# Patient Record
Sex: Female | Born: 1960
Health system: Southern US, Community
[De-identification: ages and names within clinical notes are randomized; demographics above are authoritative.]

## PROBLEM LIST (undated history)

## (undated) HISTORY — PX: BREAST CYST ASPIRATION: SHX578

---

## 2016-07-26 ENCOUNTER — Other Ambulatory Visit: Payer: Self-pay | Admitting: Obstetrics

## 2016-07-26 DIAGNOSIS — R5381 Other malaise: Secondary | ICD-10-CM

## 2016-07-27 ENCOUNTER — Other Ambulatory Visit: Payer: Self-pay | Admitting: Obstetrics

## 2016-07-27 DIAGNOSIS — R921 Mammographic calcification found on diagnostic imaging of breast: Secondary | ICD-10-CM

## 2016-07-27 DIAGNOSIS — R5381 Other malaise: Secondary | ICD-10-CM

## 2016-08-10 ENCOUNTER — Other Ambulatory Visit: Payer: Self-pay | Admitting: Obstetrics

## 2016-08-10 ENCOUNTER — Ambulatory Visit
Admission: RE | Admit: 2016-08-10 | Discharge: 2016-08-10 | Disposition: A | Discharge status: Home / Self Care | Payer: BLUE CROSS/BLUE SHIELD | Source: Ambulatory Visit | Attending: Obstetrics | Admitting: Obstetrics

## 2016-08-10 ENCOUNTER — Ambulatory Visit
Admission: RE | Admit: 2016-08-10 | Discharge: 2016-08-10 | Disposition: A | Discharge status: Home / Self Care | Payer: Self-pay | Source: Ambulatory Visit | Attending: Obstetrics | Admitting: Obstetrics

## 2016-08-10 DIAGNOSIS — R921 Mammographic calcification found on diagnostic imaging of breast: Secondary | ICD-10-CM

## 2016-08-21 ENCOUNTER — Other Ambulatory Visit: Payer: Self-pay | Admitting: Obstetrics

## 2016-08-21 DIAGNOSIS — R921 Mammographic calcification found on diagnostic imaging of breast: Secondary | ICD-10-CM

## 2016-11-06 ENCOUNTER — Ambulatory Visit
Admission: RE | Admit: 2016-11-06 | Discharge: 2016-11-06 | Disposition: A | Discharge status: Home / Self Care | Payer: BLUE CROSS/BLUE SHIELD | Source: Ambulatory Visit | Attending: Obstetrics | Admitting: Obstetrics

## 2016-11-06 ENCOUNTER — Other Ambulatory Visit: Payer: Self-pay | Admitting: Obstetrics

## 2016-11-06 DIAGNOSIS — N644 Mastodynia: Secondary | ICD-10-CM

## 2016-11-06 DIAGNOSIS — R921 Mammographic calcification found on diagnostic imaging of breast: Secondary | ICD-10-CM

## 2017-08-05 ENCOUNTER — Other Ambulatory Visit: Payer: Self-pay | Admitting: Obstetrics

## 2017-08-05 DIAGNOSIS — Z1231 Encounter for screening mammogram for malignant neoplasm of breast: Secondary | ICD-10-CM

## 2017-10-05 ENCOUNTER — Encounter (HOSPITAL_COMMUNITY): Payer: Self-pay | Admitting: Emergency Medicine

## 2017-10-05 ENCOUNTER — Other Ambulatory Visit: Payer: Self-pay

## 2017-10-05 ENCOUNTER — Emergency Department (HOSPITAL_COMMUNITY)
Admission: EM | Admit: 2017-10-05 | Discharge: 2017-10-05 | Disposition: A | Discharge status: Home / Self Care | Payer: BLUE CROSS/BLUE SHIELD | Attending: Emergency Medicine | Admitting: Emergency Medicine

## 2017-10-05 DIAGNOSIS — Y998 Other external cause status: Secondary | ICD-10-CM | POA: Insufficient documentation

## 2017-10-05 DIAGNOSIS — Y9389 Activity, other specified: Secondary | ICD-10-CM | POA: Insufficient documentation

## 2017-10-05 DIAGNOSIS — S060X0A Concussion without loss of consciousness, initial encounter: Secondary | ICD-10-CM | POA: Diagnosis not present

## 2017-10-05 DIAGNOSIS — S0990XA Unspecified injury of head, initial encounter: Secondary | ICD-10-CM | POA: Diagnosis present

## 2017-10-05 DIAGNOSIS — Y92414 Local residential or business street as the place of occurrence of the external cause: Secondary | ICD-10-CM | POA: Diagnosis not present

## 2017-10-05 MED ORDER — NAPROXEN 500 MG PO TABS: 500.0000 mg | ORAL_TABLET | Freq: Two times a day (BID) | ORAL | 0 refills | Status: AC

## 2017-10-05 MED ORDER — METHOCARBAMOL 500 MG PO TABS: 500.0000 mg | ORAL_TABLET | Freq: Every evening | ORAL | 0 refills | Status: AC | PRN
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
## 2017-10-11 ENCOUNTER — Other Ambulatory Visit: Payer: Self-pay | Admitting: Obstetrics

## 2017-10-11 DIAGNOSIS — R921 Mammographic calcification found on diagnostic imaging of breast: Secondary | ICD-10-CM

## 2017-11-08 ENCOUNTER — Ambulatory Visit
Admission: RE | Admit: 2017-11-08 | Discharge: 2017-11-08 | Disposition: A | Discharge status: Home / Self Care | Payer: BLUE CROSS/BLUE SHIELD | Source: Ambulatory Visit | Attending: Obstetrics | Admitting: Obstetrics

## 2017-11-08 DIAGNOSIS — R921 Mammographic calcification found on diagnostic imaging of breast: Secondary | ICD-10-CM

## 2018-09-25 ENCOUNTER — Other Ambulatory Visit: Payer: Self-pay | Admitting: Obstetrics

## 2018-10-11 IMAGING — MG 2D DIGITAL DIAGNOSTIC BILATERAL MAMMOGRAM WITH CAD AND ADJUNCT T
8 of 14 series · 8 of 30 positions shown · non-contrast
Comparison: Previous exam(s).

CLINICAL DATA: 55-year-old female presenting for annual bilateral
mammogram in short-term follow-up for left breast calcifications.
The patient reports intermittent left subareolar pain for several
days.

EXAM:
2D DIGITAL DIAGNOSTIC BILATERAL MAMMOGRAM WITH CAD AND ADJUNCT TOMO
ULTRASOUND LEFT BREAST

[L ML]
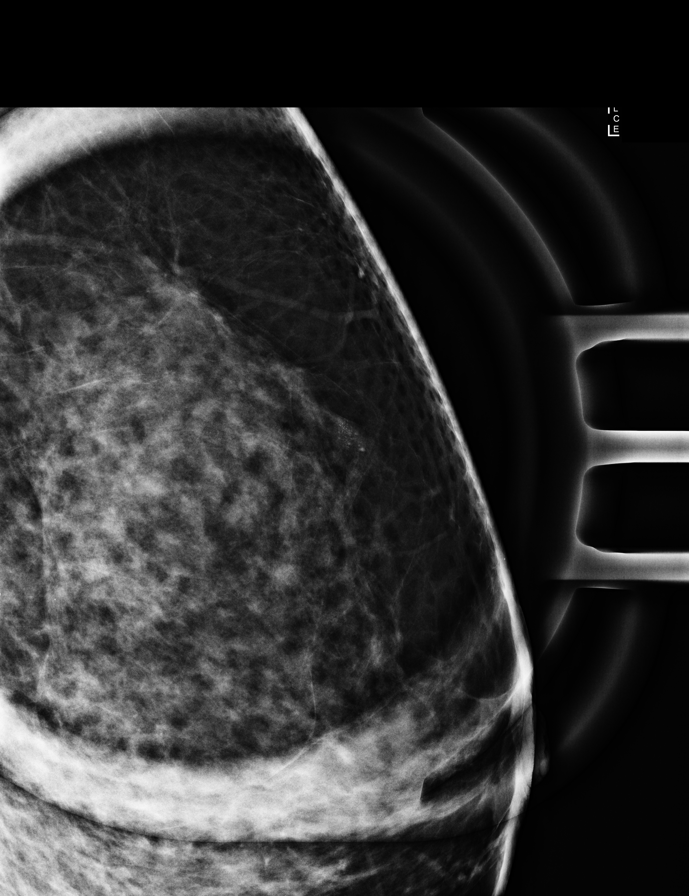

[L CC (1 of 2)]
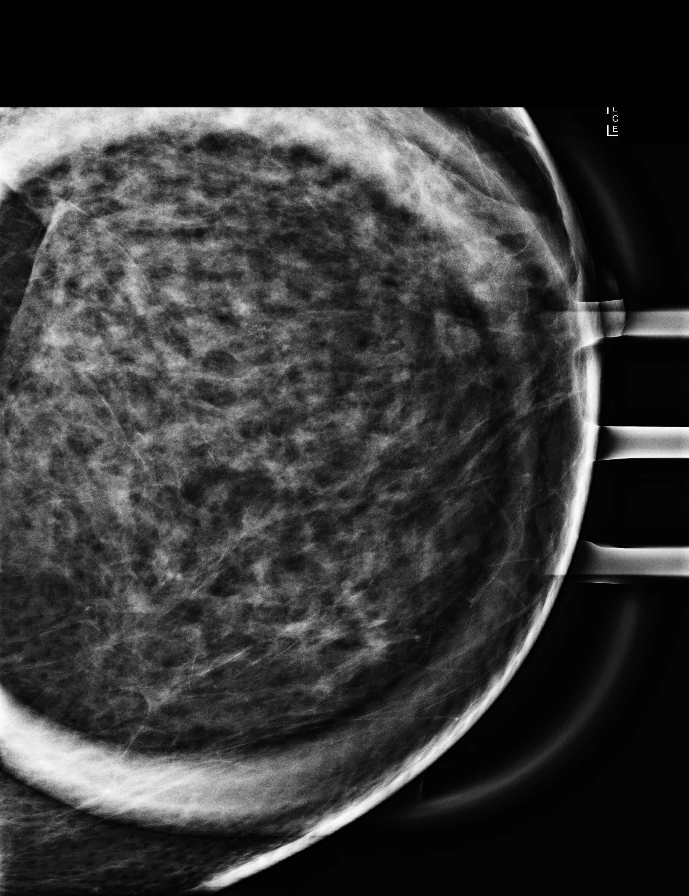

[L CC (2 of 2)]
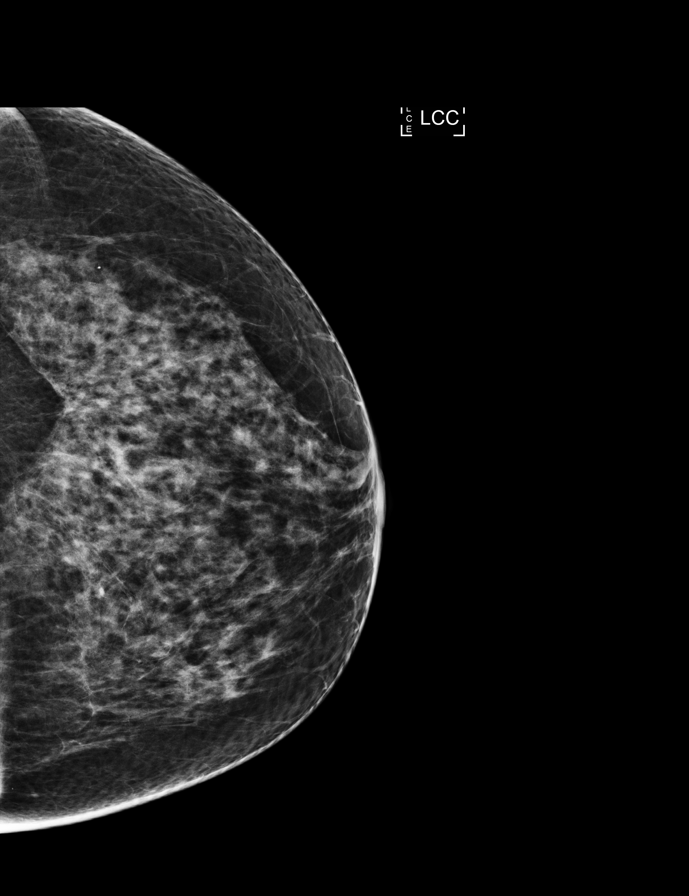

[R CC]
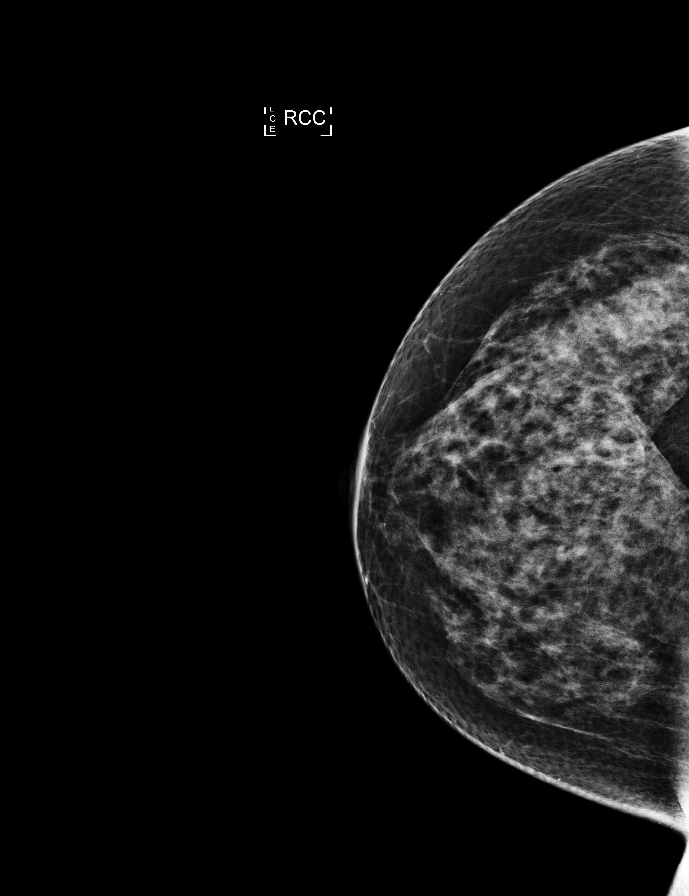

[L MLO]
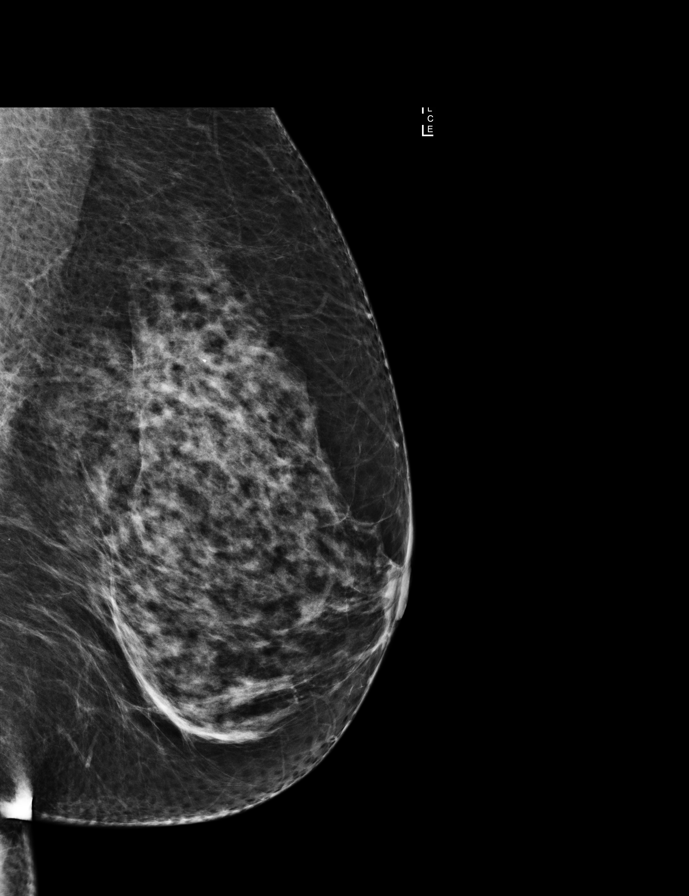

[R CC synth-2D]
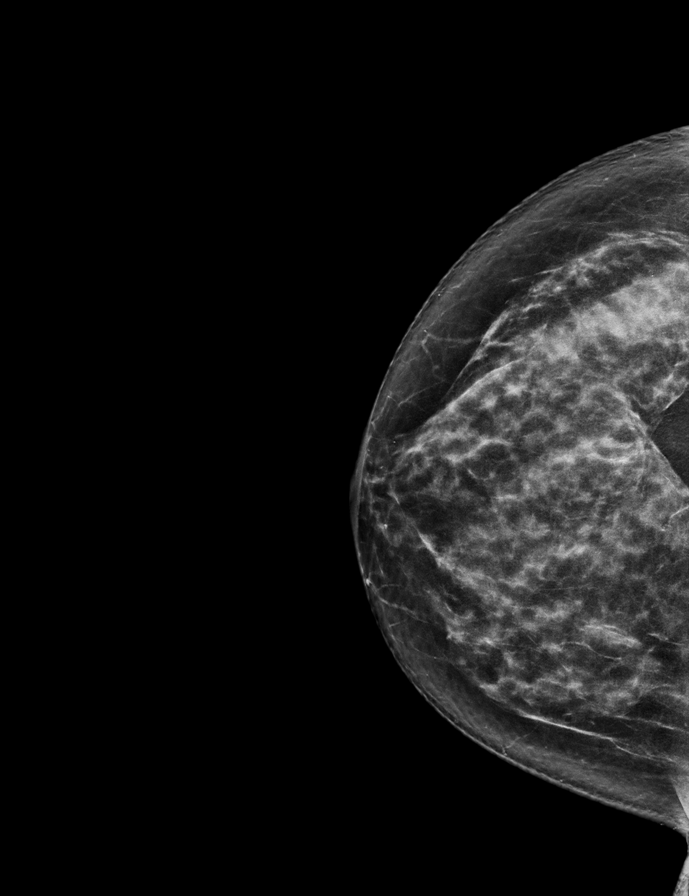

[R MLO]
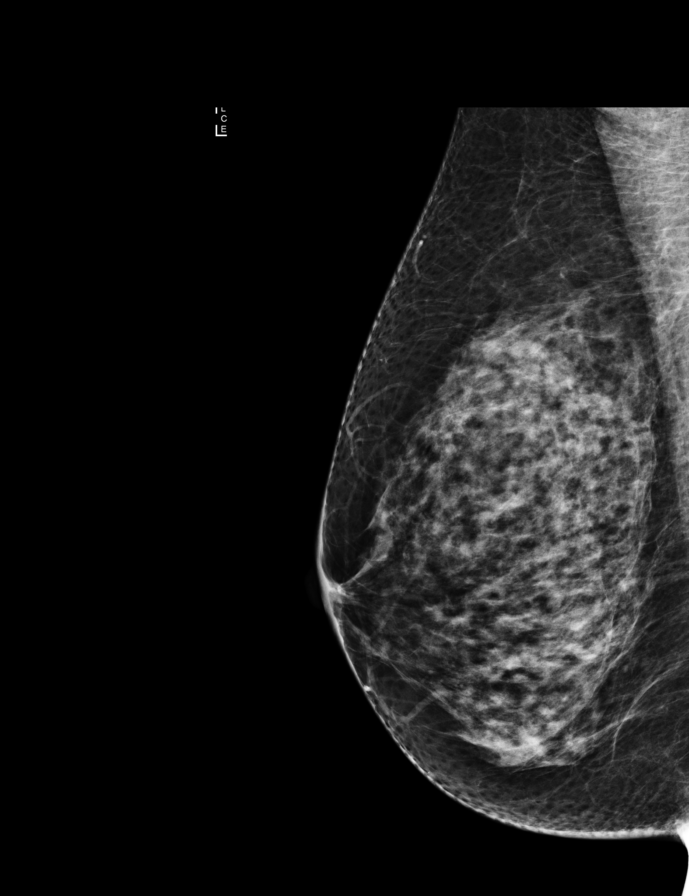

[R MLO synth-2D]
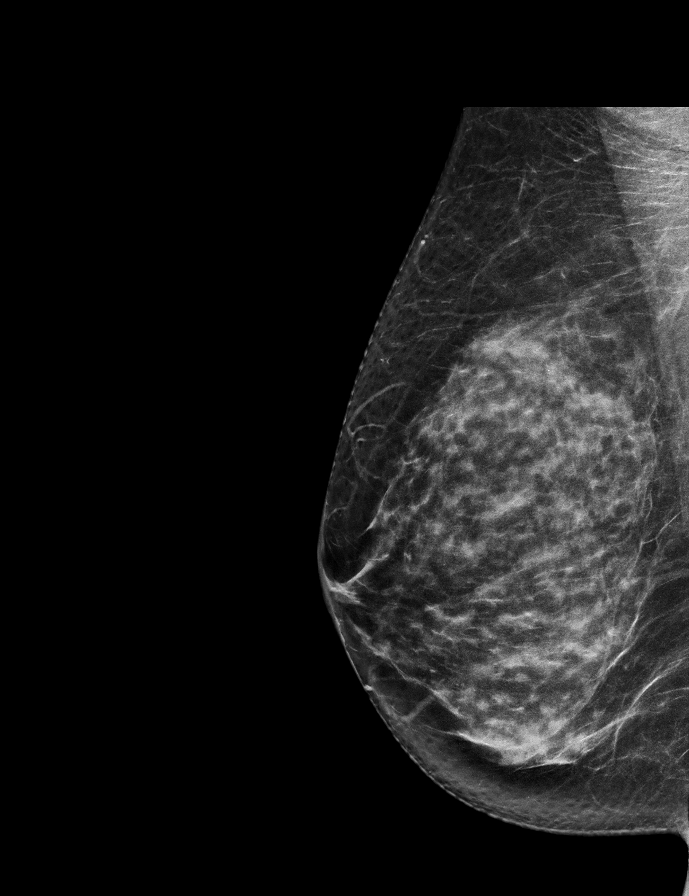

[8 of 30 positions shown; findings below may reference images not displayed]

ACR Breast Density Category c: The breast tissue is heterogeneously
dense, which may obscure small masses.
FINDINGS: Tightly grouped calcifications in the superior central left breast
at middle depth demonstrate a stable and punctate morphology. No
other suspicious masses or calcifications are identified in either
breast.

Mammographic images were processed with CAD.

On physical exam, I palpate no physical abnormality.

Targeted ultrasound is performed, showing normal fibroglandular
tissue on the left subareolar region. No focal masses, cystic
lesions or sonographic abnormalities are noted.
IMPRESSION: 1. Stable, probably benign left breast calcifications.
Recommendation is for six-month mammographic follow-up.
2. No mammographic or sonographic findings to explain the patient's
intermittent left subareolar pain. Recommendation is for clinical
and symptomatic follow-up.
3. No mammographic evidence of malignancy on the right.

RECOMMENDATION:
1. Diagnostic mammogram of the left breast in 6 months.
(Code:GD-A-22F)
2. Clinical follow-up recommended for the the patient's intermittent
left subareolar pain. Any further workup should be based on clinical
grounds.

I have discussed the findings and recommendations with the patient.
Results were also provided in writing at the conclusion of the
visit. If applicable, a reminder letter will be sent to the patient
regarding the next appointment.

BI-RADS CATEGORY  3: Probably benign.
